# Patient Record
Sex: Male | Born: 1955 | Race: White | Hispanic: No | Marital: Married | Smoking: Never smoker
Health system: Southern US, Community
[De-identification: ages and names within clinical notes are randomized; demographics above are authoritative.]

---

## 2001-04-05 ENCOUNTER — Ambulatory Visit (HOSPITAL_COMMUNITY): Admission: RE | Admit: 2001-04-05 | Discharge: 2001-04-05 | Payer: Self-pay | Admitting: *Deleted

## 2001-04-05 ENCOUNTER — Encounter: Payer: Self-pay | Admitting: *Deleted

## 2002-05-10 ENCOUNTER — Ambulatory Visit (HOSPITAL_COMMUNITY): Admission: RE | Admit: 2002-05-10 | Discharge: 2002-05-10 | Payer: Self-pay | Admitting: Gastroenterology

## 2002-05-10 ENCOUNTER — Encounter (INDEPENDENT_AMBULATORY_CARE_PROVIDER_SITE_OTHER): Payer: Self-pay | Admitting: Specialist

## 2004-10-08 ENCOUNTER — Ambulatory Visit: Payer: Self-pay | Admitting: Psychology

## 2004-10-14 ENCOUNTER — Ambulatory Visit: Payer: Self-pay | Admitting: Psychology

## 2004-10-25 ENCOUNTER — Ambulatory Visit: Payer: Self-pay | Admitting: Psychology

## 2004-11-17 ENCOUNTER — Ambulatory Visit: Payer: Self-pay | Admitting: Psychology

## 2004-11-26 ENCOUNTER — Ambulatory Visit: Payer: Self-pay | Admitting: Psychology

## 2004-12-02 ENCOUNTER — Ambulatory Visit: Payer: Self-pay | Admitting: Psychology

## 2004-12-24 ENCOUNTER — Ambulatory Visit: Payer: Self-pay | Admitting: Psychology

## 2004-12-31 ENCOUNTER — Ambulatory Visit: Payer: Self-pay | Admitting: Psychology

## 2005-01-26 ENCOUNTER — Ambulatory Visit: Payer: Self-pay | Admitting: Psychology

## 2005-02-10 ENCOUNTER — Ambulatory Visit: Payer: Self-pay | Admitting: Psychology

## 2005-02-16 ENCOUNTER — Ambulatory Visit: Payer: Self-pay | Admitting: Psychology

## 2005-02-24 ENCOUNTER — Ambulatory Visit: Payer: Self-pay | Admitting: Psychology

## 2005-03-04 ENCOUNTER — Ambulatory Visit: Payer: Self-pay | Admitting: Psychology

## 2005-03-11 ENCOUNTER — Ambulatory Visit: Payer: Self-pay | Admitting: Psychology

## 2005-03-25 ENCOUNTER — Ambulatory Visit: Payer: Self-pay | Admitting: Psychology

## 2005-04-01 ENCOUNTER — Ambulatory Visit: Payer: Self-pay | Admitting: Psychology

## 2005-04-22 ENCOUNTER — Ambulatory Visit: Payer: Self-pay | Admitting: Psychology

## 2005-05-13 ENCOUNTER — Ambulatory Visit: Payer: Self-pay | Admitting: Psychology

## 2005-05-20 ENCOUNTER — Ambulatory Visit: Payer: Self-pay | Admitting: Psychology

## 2005-05-27 ENCOUNTER — Ambulatory Visit: Payer: Self-pay | Admitting: Psychology

## 2005-06-24 ENCOUNTER — Ambulatory Visit: Payer: Self-pay | Admitting: Psychology

## 2005-07-25 ENCOUNTER — Ambulatory Visit: Payer: Self-pay | Admitting: Psychology

## 2005-08-05 ENCOUNTER — Ambulatory Visit: Payer: Self-pay | Admitting: Psychology

## 2005-08-12 ENCOUNTER — Ambulatory Visit: Payer: Self-pay | Admitting: Psychology

## 2005-08-26 ENCOUNTER — Ambulatory Visit: Payer: Self-pay | Admitting: Psychology

## 2005-09-02 ENCOUNTER — Ambulatory Visit: Payer: Self-pay | Admitting: Psychology

## 2005-10-04 ENCOUNTER — Ambulatory Visit: Payer: Self-pay | Admitting: Psychology

## 2005-10-18 ENCOUNTER — Ambulatory Visit: Payer: Self-pay | Admitting: Psychology

## 2005-10-25 ENCOUNTER — Ambulatory Visit: Payer: Self-pay | Admitting: Psychology

## 2005-11-09 ENCOUNTER — Ambulatory Visit: Payer: Self-pay | Admitting: Psychology

## 2005-11-30 ENCOUNTER — Ambulatory Visit: Payer: Self-pay | Admitting: Psychology

## 2005-12-26 ENCOUNTER — Ambulatory Visit: Payer: Self-pay | Admitting: Psychology

## 2006-01-06 ENCOUNTER — Ambulatory Visit: Payer: Self-pay | Admitting: Psychology

## 2006-02-03 ENCOUNTER — Ambulatory Visit: Payer: Self-pay | Admitting: Psychology

## 2006-02-24 ENCOUNTER — Ambulatory Visit: Payer: Self-pay | Admitting: Psychology

## 2006-03-09 ENCOUNTER — Ambulatory Visit: Payer: Self-pay | Admitting: Psychology

## 2006-04-20 ENCOUNTER — Ambulatory Visit: Payer: Self-pay | Admitting: Psychology

## 2006-05-02 ENCOUNTER — Ambulatory Visit: Payer: Self-pay | Admitting: Psychology

## 2006-05-26 ENCOUNTER — Ambulatory Visit: Payer: Self-pay | Admitting: Psychology

## 2006-06-09 ENCOUNTER — Ambulatory Visit: Payer: Self-pay | Admitting: Psychology

## 2006-06-23 ENCOUNTER — Ambulatory Visit: Payer: Self-pay | Admitting: Psychology

## 2006-07-06 ENCOUNTER — Ambulatory Visit: Payer: Self-pay | Admitting: Psychology

## 2006-07-21 ENCOUNTER — Ambulatory Visit: Payer: Self-pay | Admitting: Psychology

## 2006-08-11 ENCOUNTER — Ambulatory Visit: Payer: Self-pay | Admitting: Psychology

## 2006-08-18 ENCOUNTER — Ambulatory Visit: Payer: Self-pay | Admitting: Psychology

## 2006-08-25 ENCOUNTER — Ambulatory Visit: Payer: Self-pay | Admitting: Psychology

## 2006-09-13 ENCOUNTER — Ambulatory Visit: Payer: Self-pay | Admitting: Psychology

## 2006-10-18 ENCOUNTER — Ambulatory Visit: Payer: Self-pay | Admitting: Psychology

## 2006-11-17 ENCOUNTER — Ambulatory Visit: Payer: Self-pay | Admitting: Psychology

## 2006-12-01 ENCOUNTER — Ambulatory Visit: Payer: Self-pay | Admitting: Psychology

## 2006-12-22 ENCOUNTER — Ambulatory Visit: Payer: Self-pay | Admitting: Psychology

## 2007-01-26 ENCOUNTER — Ambulatory Visit: Payer: Self-pay | Admitting: Psychology

## 2007-03-20 ENCOUNTER — Ambulatory Visit: Payer: Self-pay | Admitting: Psychology

## 2007-04-13 ENCOUNTER — Ambulatory Visit: Payer: Self-pay | Admitting: Psychology

## 2007-04-26 ENCOUNTER — Ambulatory Visit: Payer: Self-pay | Admitting: Psychology

## 2007-05-08 ENCOUNTER — Ambulatory Visit: Payer: Self-pay | Admitting: Psychology

## 2007-05-17 ENCOUNTER — Ambulatory Visit: Payer: Self-pay | Admitting: Psychology

## 2007-05-29 ENCOUNTER — Ambulatory Visit: Payer: Self-pay | Admitting: Psychology

## 2007-06-11 ENCOUNTER — Ambulatory Visit: Payer: Self-pay | Admitting: Psychology

## 2007-07-04 ENCOUNTER — Ambulatory Visit: Payer: Self-pay | Admitting: Psychology

## 2007-07-24 ENCOUNTER — Ambulatory Visit: Payer: Self-pay | Admitting: Psychology

## 2007-08-07 ENCOUNTER — Ambulatory Visit: Payer: Self-pay | Admitting: Psychology

## 2007-08-17 ENCOUNTER — Ambulatory Visit: Payer: Self-pay | Admitting: Psychology

## 2007-10-22 ENCOUNTER — Ambulatory Visit: Payer: Self-pay | Admitting: Psychology

## 2008-04-05 ENCOUNTER — Encounter
Admission: RE | Admit: 2008-04-05 | Discharge: 2008-04-05 | Payer: Self-pay | Admitting: Physical Medicine and Rehabilitation

## 2009-01-20 ENCOUNTER — Encounter: Admission: RE | Admit: 2009-01-20 | Discharge: 2009-01-20 | Payer: Self-pay | Admitting: Orthopedic Surgery

## 2010-08-13 NOTE — Op Note (Signed)
NAMEDEVIN, Cole Jensen                         ACCOUNT NO.:  [id number]   MEDICAL RECORD NO.:  [id number]                   PATIENT TYPE:  AMB   LOCATION:  ENDO                                 FACILITY:  [facility]   PHYSICIAN:  Petra Kuba, M.D.                 DATE OF BIRTH:  1955-08-17   DATE OF PROCEDURE:  05/10/2002  DATE OF DISCHARGE:                                 OPERATIVE REPORT   PROCEDURE:  Esophagogastroduodenoscopy with biopsy.   INDICATIONS FOR PROCEDURE:  Atypical reflux.   Consent was signed after risks, benefits, methods, and options.   MEDICINES USED:  Demerol 50, Versed 7.   DESCRIPTION OF PROCEDURE:  The video endoscope was inserted by direct  vision. The esophagus was normal. In the distal esophagus a small hiatal  hernia was seen. The scope passed into the stomach, advanced through a  normal antrum, normal pylorus into a normal duodenal bulb and around the C  loop to a normal second portion of the duodenum. The scope was then  withdrawn back to the bulb. A good look there ruled out ulcers in that  location. The stomach was evaluated on retroflexion. The scope was withdrawn  back to the stomach and retroflexed. High in the cardia, the hiatal hernia  was confirmed. The fundus, angularis, lesser and greater curve were normal  on retroflexed visualization. Straight visualization of the stomach did not  reveal any additional findings. He did have some difficulty holding air but  adequate visualization was obtained. The scope was then slowly withdrawn  back to 20 cm. No additional esophageal findings were seen except for the  small hiatal hernia. We went ahead and took a few distal esophageal biopsies  to rule out any microscopic changes. Air was suctioned, scope slowly  withdrawn. The patient tolerated the procedure well. There was on obvious or  immediate complications.   ENDOSCOPIC DIAGNOSIS:  1. Small hiatal hernia.  2. Otherwise normal EGD status post  distal esophageal biopsies.    PLAN:  Trial b.i.d. Aciphex, he seems to think that is helping him. Consider  manometry, 24 hour pH and then possibly even surgical options if the Aciphex  works. Will see back p.r.n. or in 6-8 weeks to recheck symptoms on the  Aciphex and possibly will need to add a motility medicine next like either  Reglan or Zelnorm.                                                Petra Kuba, M.D.    MEM/MEDQ  D:  05/10/2002  T:  05/10/2002  Job:  [id number]   cc:   Oley Balm. Georgina Pillion, M.D.  [address]  [place]  [place] [address]  Fax: [phone]   Shan Levans, M.D. [facility]  Jen Mow, M.D.

## 2013-01-28 ENCOUNTER — Telehealth: Payer: Self-pay | Admitting: *Deleted

## 2013-01-28 ENCOUNTER — Ambulatory Visit (INDEPENDENT_AMBULATORY_CARE_PROVIDER_SITE_OTHER): Payer: Managed Care, Other (non HMO) | Admitting: Podiatry

## 2013-01-28 ENCOUNTER — Encounter: Payer: Self-pay | Admitting: Podiatry

## 2013-01-28 VITALS — BP 140/86 | HR 86 | Resp 16

## 2013-01-28 DIAGNOSIS — M722 Plantar fascial fibromatosis: Secondary | ICD-10-CM

## 2013-01-28 NOTE — Progress Notes (Signed)
°  Subjective:    Patient ID: Cole Jensen, male    DOB: 1956-01-09, 57 y.o.   MRN: [id number] left  HPI left heel is hurting and I saw Dr Charlsie Merles at the gym this past weekend    Review of Systems  Constitutional: Negative.   HENT: Negative.   Eyes:       Dry eye  Respiratory: Negative.   Cardiovascular: Negative.   Gastrointestinal: Negative.   Endocrine: Negative.   Genitourinary: Negative.   Musculoskeletal: Negative.   Skin: Negative.   Allergic/Immunologic: Negative.   Neurological: Negative.   Hematological: Negative.   Psychiatric/Behavioral: Negative.        Objective:   Physical Exam        Assessment & Plan:

## 2013-01-28 NOTE — Telephone Encounter (Signed)
Pt states that he cannot wear the complicated boot we gave him today and will be by Monday to return it.  I called and encouraged pt wear the boot for positioning of the plantar fascia and that I would be happy to show him a easy to take it off and on.

## 2013-01-28 NOTE — Progress Notes (Signed)
Subjective:     Patient ID: Cole Jensen, male   DOB: [birth year], 57 y.o.   MRN: [id number]  HPI patient presents stating that his left heel is still hurting when he tries to exercise   Review of Systems  All other systems reviewed and are negative.       Objective:   Physical Exam  Nursing note and vitals reviewed. Constitutional: He is oriented to person, place, and time.  Cardiovascular: Intact distal pulses.   Musculoskeletal: Normal range of motion.  Neurological: He is oriented to person, place, and time.  Skin: Skin is warm.   pain to palpation of the left heel at its insertion to the medial tubercle     Assessment:     Plantar fasciitis left heel of long-term duration    Plan:     Recommended E. Pat treatment. This was performed and I was able to get him to level 4 2500 shocks and dispensed air fracture walker with instructions on usage. Reappoint one week

## 2013-01-29 NOTE — Telephone Encounter (Signed)
thanks

## 2013-01-31 ENCOUNTER — Telehealth: Payer: Self-pay | Admitting: *Deleted

## 2013-01-31 NOTE — Telephone Encounter (Signed)
Pt states has redness and lite blistery rash to heel.  Could this have been from the EPAT or the ice packs?  Return call - left message informed EPAT rarely but could cause rash, but if left ice pack directly on the skin it could also.  Instructed pt to apply antibiotic ointment and to place towel between skin and ice pack, use no longer then 10 - 15 mins at a time.

## 2013-02-04 ENCOUNTER — Encounter: Payer: Self-pay | Admitting: Podiatry

## 2013-02-04 ENCOUNTER — Ambulatory Visit (INDEPENDENT_AMBULATORY_CARE_PROVIDER_SITE_OTHER): Payer: Managed Care, Other (non HMO) | Admitting: Podiatry

## 2013-02-04 VITALS — BP 138/79 | HR 75 | Resp 12

## 2013-02-04 DIAGNOSIS — M722 Plantar fascial fibromatosis: Secondary | ICD-10-CM

## 2013-02-05 NOTE — Progress Notes (Signed)
Subjective:     Patient ID: Cole Jensen, male   DOB: 1955-07-04, 57 y.o.   MRN: [id number]  HPI heel is feeling better at this time   Review of Systems     Objective:   Physical Exam Diminished discomfort to pressure left plantar heel    Assessment:     Plantar fasciitis improving E. Pat    Plan:     2500 shocks 4.0 on energy level

## 2013-02-11 ENCOUNTER — Ambulatory Visit: Payer: Managed Care, Other (non HMO)

## 2013-02-11 ENCOUNTER — Ambulatory Visit (INDEPENDENT_AMBULATORY_CARE_PROVIDER_SITE_OTHER): Payer: Managed Care, Other (non HMO) | Admitting: Podiatry

## 2013-02-11 VITALS — BP 128/78 | HR 67 | Resp 16 | Ht 68.0 in | Wt 140.0 lb

## 2013-02-11 DIAGNOSIS — M722 Plantar fascial fibromatosis: Secondary | ICD-10-CM

## 2013-02-12 NOTE — Progress Notes (Signed)
Subjective:     Patient ID: Cole Jensen, male   DOB: January 27, 1956, 57 y.o.   MRN: [id number]  HPI patient states still improving but present   Review of Systems     Objective:   Physical Exam Continued discomfort plantar fascia heel    Assessment:     E. Pat administered and I was able to get a 4.6 on energy 2500 shocks    Plan:     Reappoint in 4 weeks and continue immobilization

## 2013-03-11 ENCOUNTER — Ambulatory Visit (INDEPENDENT_AMBULATORY_CARE_PROVIDER_SITE_OTHER): Payer: Managed Care, Other (non HMO) | Admitting: Podiatry

## 2013-03-11 ENCOUNTER — Encounter: Payer: Self-pay | Admitting: Podiatry

## 2013-03-11 VITALS — BP 121/82 | HR 82 | Resp 12

## 2013-03-11 DIAGNOSIS — M722 Plantar fascial fibromatosis: Secondary | ICD-10-CM

## 2013-03-11 NOTE — Progress Notes (Signed)
Subjective:     Patient ID: Cole Jensen, male   DOB: 1956-02-23, 57 y.o.   MRN: [id number]  HPI heel is improving  Review of Systems     Objective:   Physical Exam Pain to palpation heel diminishing    Assessment:     Improving plantar fasciitis    Plan:     EDennie Bible it ministered 4.0 2500 shocks

## 2015-11-04 ENCOUNTER — Ambulatory Visit (INDEPENDENT_AMBULATORY_CARE_PROVIDER_SITE_OTHER): Payer: Managed Care, Other (non HMO) | Admitting: Podiatry

## 2015-11-04 ENCOUNTER — Ambulatory Visit (INDEPENDENT_AMBULATORY_CARE_PROVIDER_SITE_OTHER): Payer: Managed Care, Other (non HMO)

## 2015-11-04 ENCOUNTER — Encounter: Payer: Self-pay | Admitting: Podiatry

## 2015-11-04 VITALS — BP 128/78 | HR 69

## 2015-11-04 DIAGNOSIS — M79672 Pain in left foot: Secondary | ICD-10-CM | POA: Diagnosis not present

## 2015-11-04 DIAGNOSIS — M779 Enthesopathy, unspecified: Secondary | ICD-10-CM

## 2015-11-04 MED ORDER — DICLOFENAC SODIUM 75 MG PO TBEC: 75.0000 mg | DELAYED_RELEASE_TABLET | Freq: Two times a day (BID) | ORAL | 2 refills | Status: AC

## 2015-11-04 NOTE — Progress Notes (Signed)
   Subjective:    Patient ID: Cole RamusRobert Jensen, male    DOB: Dec 10, 1955, 60 y.o.   MRN: [id number]  HPI    Review of Systems  All other systems reviewed and are negative.      Objective:   Physical Exam        Assessment & Plan:

## 2015-11-05 NOTE — Progress Notes (Signed)
Subjective:     Patient ID: Cole Jensen, male   DOB: Mar 06, 1956, 60 y.o.   MRN: [id number]  HPI patient presents with pain in the big toe joint with history of injury left foot   Review of Systems     Objective:   Physical Exam Neurovascular status intact muscle strength adequate with inflammation around the first MPJ left but no restriction of motion or crepitus    Assessment:     Appears to be more inflammatory    Plan:     Cole FlesherWent ahead did a very careful injection around the first MPJ 3 mg dexamethasone Kenalog 5 mg Xylocaine and advised on rigid bottom shoes and boot that he has  X-rays negative for signs of spur calcification or reduction of joint space

## 2016-03-08 ENCOUNTER — Other Ambulatory Visit: Payer: Self-pay | Admitting: Internal Medicine

## 2016-03-08 ENCOUNTER — Ambulatory Visit
Admission: RE | Admit: 2016-03-08 | Discharge: 2016-03-08 | Disposition: A | Discharge status: Home / Self Care | Payer: 59 | Source: Ambulatory Visit | Attending: Internal Medicine | Admitting: Internal Medicine

## 2016-03-08 DIAGNOSIS — R059 Cough, unspecified: Secondary | ICD-10-CM

## 2016-03-08 DIAGNOSIS — R05 Cough: Secondary | ICD-10-CM

## 2016-11-16 ENCOUNTER — Encounter: Payer: Self-pay | Admitting: Internal Medicine

## 2016-11-16 ENCOUNTER — Ambulatory Visit (INDEPENDENT_AMBULATORY_CARE_PROVIDER_SITE_OTHER): Payer: Managed Care, Other (non HMO) | Admitting: Internal Medicine

## 2016-11-16 DIAGNOSIS — B182 Chronic viral hepatitis C: Secondary | ICD-10-CM | POA: Diagnosis not present

## 2016-11-16 NOTE — Patient Instructions (Signed)
Date 11/16/16  Dear Cole Jensen; As discussed in the ID Clinic, your hepatitis C therapy will include highly effective medication(s) for treatment and will vary based on the type of hepatitis C and insurance approval.  Potential medications include:          Harvoni (sofosbuvir 90mg /ledipasvir 400mg ) tablet oral daily          OR     Epclusa (sofosbuvir 400mg /velpatasvir 100mg ) tablet oral daily          OR      Mavyret (glecaprevir 100 mg/pibrentasvir 40 mg): Take 3 tablets oral daily          OR     Zepatier (elbasvir 50 mg/grazoprevir 100 mg) oral daily, +/- ribavirin              Medications are typically for 8 or 12 weeks total ---------------------------------------------------------------- Your HCV Treatment Start Date: You will be notified by our office once the medication is approved and where you can pick it up (or if mailed)   ---------------------------------------------------------------- YOUR PHARMACY CONTACT:   [facility] [address] [place], [place] [address] Phone: [phone] Hours: Monday to Friday 7:30 am to 6:00 pm   Please always contact your pharmacy at least 3-4 business days before you run out of medications to ensure your next month's medication is ready or 1 week prior to running out if you receive it by mail.  Remember, each prescription is for 28 days. ---------------------------------------------------------------- GENERAL NOTES REGARDING YOUR HEPATITIS C MEDICATION:  Some medications have the following interactions:  - Acid reducing agents such as H2 blockers (ie. Pepcid (famotidine), Zantac (ranitidine), Tagamet (cimetidine), Axid (nizatidine) and proton pump inhibitors (ie. Prilosec (omeprazole), Protonix (pantoprazole), Nexium (esomeprazole), or Aciphex (rabeprazole)). Do not take until you have discussed with a health care provider.    -Antacids that contain magnesium and/or aluminum hydroxide (ie. Milk of Magensia, Rolaids,  Gaviscon, Maalox, Mylanta, an dArthritis Pain Formula).  -Calcium carbonate (calcium supplements or antacids such as Tums, Caltrate, Os-Cal).  -St. John's wort or any products that contain St. John's wort like some herbal supplements  Please inform the office prior to starting any of these medications.  - The common side effects associated with Harvoni include:      1. Fatigue      2. Headache      3. Nausea      4. Diarrhea      5. Insomnia  Please note that this only lists the most common side effects and is NOT a comprehensive list of the potential side effects of these medications. For more information, please review the drug information sheets that come with your medication package from the pharmacy.  ---------------------------------------------------------------- GENERAL HELPFUL HINTS ON HCV THERAPY: 1. Stay well-hydrated. 2. Notify the ID Clinic of any changes in your other over-the-counter/herbal or prescription medications. 3. If you miss a dose of your medication, take the missed dose as soon as you remember. Return to your regular time/dose schedule the next day.  4.  Do not stop taking your medications without first talking with your healthcare provider. 5.  You may take Tylenol (acetaminophen), as long as the dose is less than 2000 mg (OR no more than 4 tablets of the Tylenol Extra Strengths 500mg  tablet) in 24 hours. 6.  You will see our pharmacist-specialist within the first 2 weeks of starting your medication to monitor for any possible side effects. 7.  You will have labs once during treatment, soon  after treatment completion and one final lab 6 months after treatment completion to verify the virus is out of your system.  Cole Jensen, [place] for [place], [place]  [id number] [phone]

## 2016-11-16 NOTE — Progress Notes (Signed)
[facility]   CC: consideration for treatment for chronic hepatitis C  HPI:  +Cole Jensen is a 61 y.o. male who presents for initial evaluation and management of chronic hepatitis C.  Patient tested positive earlier this year during routine screening. Hepatitis C-associated risk factors present are: none. Patient denies history of blood transfusion, intranasal drug use, IV drug abuse, renal dialysis, sexual contact with person with liver disease, tattoos. Patient has had other studies performed. Results: hepatitis C RNA by PCR, result: positive. Patient has not had prior treatment for Hepatitis C. Patient does not have a past history of liver disease. Patient does not have a family history of liver disease. Patient does not  have associated signs or symptoms related to liver disease.  Labs reviewed and confirm chronic hepatitis C with a positive viral load, though just 348 copies.  Was previously tested earlier in the year and was undetectable.     Patient does not have documented immunity to Hepatitis A. Patient does not have documented immunity to Hepatitis B.    Review of Systems:  Constitutional: negative for fatigue and malaise Gastrointestinal: negative for diarrhea Musculoskeletal: negative for myalgias and arthralgias All other systems reviewed and are negative       PMH: insomnia, ED  Prior to Admission medications   Medication Sig Start Date End Date Taking? Authorizing Provider  X-VIATE 40 % CREA  02/18/13  Yes [provider]  diclofenac (VOLTAREN) 75 MG EC tablet Take 1 tablet (75 mg total) by mouth 2 (two) times daily. Patient not taking: Reported on 11/16/2016 11/04/15   Lenn Sink, DPM    Allergies  Allergen Reactions  . Sulfa Antibiotics     Social History  Substance Use Topics  . Smoking status: Never Smoker  . Smokeless tobacco: Never Used  . Alcohol use 1.8 oz/week    3 Glasses of wine per week     Comment:  daily     FMH: negative for cirrhosis, liver cancer   Objective:  Constitutional: in no apparent distress and alert,  Vitals:   11/16/16 0954  BP: (!) 171/100  Pulse: 73  Temp: 98.3 F (36.8 C)   Eyes: anicteric Cardiovascular: Cor RRR Respiratory: CTA B; normal respiratory effort Gastrointestinal: Bowel sounds are normal, liver is not enlarged, spleen is not enlarged Musculoskeletal: no pedal edema noted Skin: negatives: no rash; no porphyria cutanea tarda Lymphatic: no cervical lymphadenopathy   Laboratory Genotype: No results found for: HCVGENOTYPE HCV viral load: No results found for: HCVQUANT No results found for: WBC, HGB, HCT, MCV, PLT No results found for: CREATININE, BUN, NA, K, CL, CO2 No results found for: ALT, AST, GGT, ALKPHOS   Labs and history reviewed and show CHILD-PUGH unknown  5-6 points: Child class A 7-9 points: Child class B 10-15 points: Child class C  No results found for: INR, BILITOT, ALBUMIN   Assessment: New Patient with Chronic Hepatitis C genotype unknown, untreated.  I discussed with the patient the lab findings that confirm chronic hepatitis C as well as the natural history and progression of disease including about 30% of people who develop cirrhosis of the liver if left untreated and once cirrhosis is established there is a 2-7% risk per year of liver cancer and liver failure.  I discussed the importance of treatment and benefits in reducing the risk, even if significant liver fibrosis exists.   Plan: 1) Patient counseled extensively on limiting acetaminophen to no more than 2  grams daily, avoidance of alcohol. 2) Transmission discussed with patient including sexual transmission, sharing razors and toothbrush.   3) Will need referral to gastroenterology if concern for cirrhosis 4) Will need referral for substance abuse counseling: No.; Further work up to include urine drug screen  No. 5) Will prescribe appropriate medication based on  genotype and coverage  6) Hepatitis A and B titers 7) Pneumovax vaccine if not previously given - likely done by PCP due to age 13) Further work up to include liver staging with elastography 10) will follow up after starting medication  Unusually low viral load, fluctuating from undetected to minimal detection.  The risk benefit of treatment still favors treatment with minimal side effects.  Certainly if there is any liver fibrosis, it is indicated.   I will have him go ahead and get the elastography but wait until her returns from [place] in October to get the labs and start the process for the medication with the lab results in October.

## 2016-11-22 ENCOUNTER — Ambulatory Visit (HOSPITAL_COMMUNITY)
Admission: RE | Admit: 2016-11-22 | Discharge: 2016-11-22 | Disposition: A | Discharge status: Home / Self Care | Payer: Managed Care, Other (non HMO) | Source: Ambulatory Visit | Attending: Internal Medicine | Admitting: Internal Medicine

## 2016-11-22 DIAGNOSIS — B182 Chronic viral hepatitis C: Secondary | ICD-10-CM | POA: Insufficient documentation

## 2016-11-30 ENCOUNTER — Other Ambulatory Visit: Payer: Managed Care, Other (non HMO)

## 2016-11-30 DIAGNOSIS — B182 Chronic viral hepatitis C: Secondary | ICD-10-CM

## 2016-12-03 LAB — CBC WITH DIFFERENTIAL/PLATELET
BASOS ABS: 28 {cells}/uL (ref 0–200)
Basophils Relative: 0.4 %
EOS PCT: 3.9 %
Eosinophils Absolute: 269 cells/uL (ref 15–500)
HEMATOCRIT: 42.5 % (ref 38.5–50.0)
Hemoglobin: 14.4 g/dL (ref 13.2–17.1)
Lymphs Abs: 1932 cells/uL (ref 850–3900)
MCH: 32.2 pg (ref 27.0–33.0)
MCHC: 33.9 g/dL (ref 32.0–36.0)
MCV: 95.1 fL (ref 80.0–100.0)
MPV: 9.8 fL (ref 7.5–12.5)
Monocytes Relative: 10.4 %
NEUTROS PCT: 57.3 %
Neutro Abs: 3954 cells/uL (ref 1500–7800)
PLATELETS: 331 10*3/uL (ref 140–400)
RBC: 4.47 10*6/uL (ref 4.20–5.80)
RDW: 12.7 % (ref 11.0–15.0)
TOTAL LYMPHOCYTE: 28 %
WBC mixed population: 718 cells/uL (ref 200–950)
WBC: 6.9 10*3/uL (ref 3.8–10.8)

## 2016-12-03 LAB — HIV ANTIBODY (ROUTINE TESTING W REFLEX): HIV 1&2 Ab, 4th Generation: NONREACTIVE

## 2016-12-03 LAB — COMPLETE METABOLIC PANEL WITH GFR
AG Ratio: 2 (calc) (ref 1.0–2.5)
ALBUMIN MSPROF: 4.5 g/dL (ref 3.6–5.1)
ALT: 14 U/L (ref 9–46)
AST: 17 U/L (ref 10–35)
Alkaline phosphatase (APISO): 55 U/L (ref 40–115)
BILIRUBIN TOTAL: 1 mg/dL (ref 0.2–1.2)
BUN: 16 mg/dL (ref 7–25)
CALCIUM: 9.5 mg/dL (ref 8.6–10.3)
CO2: 28 mmol/L (ref 20–32)
CREATININE: 0.76 mg/dL (ref 0.70–1.25)
Chloride: 103 mmol/L (ref 98–110)
GFR, EST AFRICAN AMERICAN: 114 mL/min/{1.73_m2} (ref >=60)
GFR, EST NON AFRICAN AMERICAN: 98 mL/min/{1.73_m2} (ref >=60)
Globulin: 2.2 g/dL (calc) (ref 1.9–3.7)
Glucose, Bld: 83 mg/dL (ref 65–99)
Potassium: 4.6 mmol/L (ref 3.5–5.3)
Sodium: 140 mmol/L (ref 135–146)
TOTAL PROTEIN: 6.7 g/dL (ref 6.1–8.1)

## 2016-12-03 LAB — HCV RNA,QN PCR RFLX GENO, LIPABAD
HCV RNA, PCR, QN (Log): <1.18 log IU/mL
HCV RNA, PCR, QN: <15 IU/mL

## 2016-12-03 LAB — LIVER FIBROSIS, FIBROTEST-ACTITEST
ALT: 15 U/L (ref 9–46)
Alpha-2-Macroglobulin: 166 mg/dL (ref 106–279)
Apolipoprotein A1: 234 mg/dL — ABNORMAL HIGH (ref 94–176)
Bilirubin: 0.6 mg/dL (ref 0.2–1.2)
Fibrosis Score: 0.09
GGT: 19 U/L (ref 3–70)
Haptoglobin: 106 mg/dL (ref 43–212)
NECROINFLAMMAT ACT SCORE: 0.04
REFERENCE ID: 2098310

## 2016-12-03 LAB — HEPATITIS A ANTIBODY, TOTAL: HEPATITIS A AB,TOTAL: NONREACTIVE

## 2016-12-03 LAB — HEPATITIS B SURFACE ANTIGEN: Hepatitis B Surface Ag: NONREACTIVE

## 2016-12-03 LAB — HEPATITIS B CORE ANTIBODY, TOTAL: HEP B C TOTAL AB: NONREACTIVE

## 2016-12-03 LAB — PROTIME-INR
INR: 1
PROTHROMBIN TIME: 10.5 s (ref 9.0–11.5)

## 2016-12-03 LAB — HEPATITIS B SURFACE ANTIBODY,QUALITATIVE: Hep B S Ab: BORDERLINE — AB

## 2016-12-15 ENCOUNTER — Encounter: Payer: Self-pay | Admitting: Internal Medicine

## 2016-12-15 ENCOUNTER — Ambulatory Visit (INDEPENDENT_AMBULATORY_CARE_PROVIDER_SITE_OTHER): Payer: Managed Care, Other (non HMO) | Admitting: Internal Medicine

## 2016-12-15 VITALS — BP 151/90 | HR 60 | Temp 98.4°F | Ht 68.0 in | Wt 142.0 lb

## 2016-12-15 DIAGNOSIS — Z23 Encounter for immunization: Secondary | ICD-10-CM | POA: Diagnosis not present

## 2016-12-15 DIAGNOSIS — B182 Chronic viral hepatitis C: Secondary | ICD-10-CM | POA: Diagnosis not present

## 2016-12-15 NOTE — Progress Notes (Signed)
   Subjective:    Patient ID: Cole Jensen, male    DOB: Mar 30, 1955, 61 y.o.   MRN: [id number]  HPI Here for follow up of HCV. I saw him as a new patient and underwent evaluation.  He initially had a negative viral load and repeated and was a low positive virus.  He was sent to me and I pursued a work up including labs and elastography but the viral load was again undetected.  His elastography is F2/3 but the Fibrosure is F0 and A0.     Review of Systems  Constitutional: Negative for fatigue.  Gastrointestinal: Negative for diarrhea.  Skin: Negative for rash.  Neurological: Negative for dizziness.       Objective:   Physical Exam  Constitutional: He appears well-developed and well-nourished. No distress.  Eyes: No scleral icterus.          Assessment & Plan:

## 2016-12-15 NOTE — Assessment & Plan Note (Signed)
I discussed with him all of the results and my interpretation of everything.  His viral load initially was negative and then minimally positive but on recheck with me, it is again negative.  I discussed with him that I have seen that in persons who have completed treatment with a minimal but detectable virus that later shows clearance and is consistent with a false positive.   In addition, his Fibrosure blood test has 0 inflammatory activity (A0) and no fibrosis.  His elastography is a bit contradictory with a score of F2/3 but I explained to him that it is not a definitive test and taking all together, I do not suspect he has any fibrosis.  I also don't think repeating it will offer any benefit as the results would not change management.   He is ok for sexually activity without risk to his wife.  His antibody will always remain positive. He can return PRN.  15 minutes spent including 15 minutes face to face counseling and going over all the results with explanation.

## 2018-05-18 ENCOUNTER — Other Ambulatory Visit: Payer: Self-pay | Admitting: Internal Medicine

## 2018-05-18 ENCOUNTER — Ambulatory Visit
Admission: RE | Admit: 2018-05-18 | Discharge: 2018-05-18 | Disposition: A | Discharge status: Home / Self Care | Payer: Managed Care, Other (non HMO) | Source: Ambulatory Visit | Attending: Internal Medicine | Admitting: Internal Medicine

## 2018-05-18 DIAGNOSIS — M1990 Unspecified osteoarthritis, unspecified site: Secondary | ICD-10-CM

## 2019-09-24 ENCOUNTER — Other Ambulatory Visit: Payer: Self-pay | Admitting: Family Medicine

## 2019-09-24 ENCOUNTER — Ambulatory Visit
Admission: RE | Admit: 2019-09-24 | Discharge: 2019-09-24 | Disposition: A | Discharge status: Home / Self Care | Payer: 59 | Source: Ambulatory Visit | Attending: Family Medicine | Admitting: Family Medicine

## 2019-09-24 DIAGNOSIS — M79642 Pain in left hand: Secondary | ICD-10-CM

## 2021-03-23 ENCOUNTER — Other Ambulatory Visit: Payer: Self-pay | Admitting: Sports Medicine

## 2021-03-23 ENCOUNTER — Other Ambulatory Visit: Payer: Self-pay

## 2021-03-23 ENCOUNTER — Ambulatory Visit
Admission: RE | Admit: 2021-03-23 | Discharge: 2021-03-23 | Disposition: A | Discharge status: Home / Self Care | Payer: 59 | Source: Ambulatory Visit | Attending: Sports Medicine | Admitting: Sports Medicine

## 2021-03-23 DIAGNOSIS — M25551 Pain in right hip: Secondary | ICD-10-CM

## 2022-09-07 ENCOUNTER — Ambulatory Visit: Payer: Medicare Other | Admitting: Behavioral Health

## 2022-09-07 DIAGNOSIS — F4323 Adjustment disorder with mixed anxiety and depressed mood: Secondary | ICD-10-CM

## 2022-09-07 NOTE — Progress Notes (Signed)
[facility] Counselor Initial Adult Exam  Name: Rami Chapp Date: 09/07/2022 MRN: [id number] DOB: 08-11-1955 PCP: Nila Nephew, MD  Time spent: 60 min In person @ [facility]  Guardian/Payee:  Medicare Part A & B    Paperwork requested: No   Reason for Visit /Presenting Problem: Pt exp'g death anxiety, fears of future caregiving for his Wife, & loneliness  Mental Status Exam: Appearance:   Casual     Behavior:  Appropriate and Sharing  Motor:  Normal  Speech/Language:   Clear and Coherent and Normal Rate  Affect:  Appropriate  Mood:  normal  Thought process:  normal  Thought content:    WNL  Sensory/Perceptual disturbances:    WNL  Orientation:  oriented to person, place, and time/date  Attention:  Good  Concentration:  Good  Memory:  WNL  Fund of knowledge:   Good  Insight:    Good  Judgment:   Good  Impulse Control:  Good    Risk Assessment: Danger to Self:  No Self-injurious Behavior: No Danger to Others: No Duty to Warn:no Physical Aggression / Violence:No  Access to Firearms a concern: No  Gang Involvement:No  Patient / guardian was educated about steps to take if suicide or homicide risk level increases between visits: yes; appropriate to ICD process While future psychiatric events cannot be accurately predicted, the patient does not currently require acute inpatient psychiatric care and does not currently meet [facility] involuntary commitment criteria.  Substance Abuse History: Current substance abuse: Yes   Pt self-identifies the need to curb his habit of drinking wine every night to numb his concerns for his marital relationship, death anxiety, feeling lonely, & his desire to avoid future caregiving for his Wife Terri.  Past Psychiatric History:   No previous psychological problems have been observed Outpatient Providers:Dr. Concha Se, MD History of Psych Hospitalization: No  Psychological Testing:  NA    Abuse History:   Victim of: No.,  NA    Report needed: No. Victim of Neglect:No. Perpetrator of  NA   Witness / Exposure to Domestic Violence: No   Protective Services Involvement: No  Witness to [employer] Violence:  No   Family History: No family history on file.  Living situation: the patient lives with their spouse  Sexual Orientation: Straight  Relationship Status: married 40 yrs this Anniversary & known since he was Sr. In [employer] Name of spouse / other:Terri If a parent, number of children / ages:32yo Dtr Hale Bogus who lives in [place] & stayed after her Graduation from [employer] in [place]  Support Systems: friends Dtr Unsure the role Wife plays in this relationship. 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 Service: No   Educational History: Education: college graduate  Religion/Sprituality/World View: Unk  Any cultural differences that may affect / interfere with treatment:  None noted today  Recreation/Hobbies: music; Pt plays guitar, tennis, & likes to stay busy tutoring students  Stressors: Loss of his expectations for him & Wife to cont to travel & her health posing many issues for them now   Marital or family conflict    Strengths: Supportive Relationships, Family, Friends, Hopefulness, and Self Advocate  Barriers:  None noted today   Legal History: Pending legal issue / charges: The patient has no significant history of legal issues.  History of legal issue / charges:  NA  Medical History/Surgical History: reviewed No past medical history on file.  No past surgical history on file.  Medications: Current Outpatient Medications  Medication Sig Dispense Refill   diclofenac (VOLTAREN) 75 MG EC tablet Take 1 tablet (75 mg total) by mouth 2 (two) times daily. (Patient not  taking: Reported on 11/16/2016) 50 tablet 2   X-VIATE 40 % CREA      No current facility-administered medications for this visit.    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 He will keep a Notebook to record his thoughts, feelings, & challenges btwn our visits. Pt goals include:   -Death anxiety - explore the source of this  -Fear of the Caregiving role  -Explore fears of the future w/his marriage & his Wife's health issues  -Explore loneliness   Target Date: 10/10/2022 Progress: 2 Frequency: Once every 3-4 wks Modality: [name], LMFT

## 2022-09-07 NOTE — Progress Notes (Signed)
                Seaton Hofmann L Shivaay Stormont, LMFT 

## 2022-09-27 ENCOUNTER — Ambulatory Visit: Payer: Medicare Other | Admitting: Behavioral Health

## 2022-10-19 ENCOUNTER — Ambulatory Visit (INDEPENDENT_AMBULATORY_CARE_PROVIDER_SITE_OTHER): Payer: Medicare Other | Admitting: Behavioral Health

## 2022-10-19 DIAGNOSIS — F4323 Adjustment disorder with mixed anxiety and depressed mood: Secondary | ICD-10-CM | POA: Diagnosis not present

## 2022-10-19 NOTE — Progress Notes (Signed)
                Victoria L Winstead, LMFT 

## 2022-10-19 NOTE — Progress Notes (Addendum)
[facility] Counselor/Therapist Progress Note  Patient ID: Cole Jensen, MRN: [id number],    Date: 10/19/2022  Time Spent: 55 min In Person @ [facility] - [facility] Office  Time In: 9:00am Time Out: 9:55am  Treatment Type: Individual Therapy  Reported Symptoms: Some reduction in anx/dep & marital stress as Pt is thinking through the therapeutic process & recording his thoughts in a Notebook  Mental Status Exam: Appearance:  Casual and Neat     Behavior: Appropriate and Sharing  Motor: Normal  Speech/Language:  Clear and Coherent and Normal Rate  Affect: Appropriate  Mood: anxious  Thought process: normal  Thought content:   WNL  Sensory/Perceptual disturbances:   WNL  Orientation: oriented to person, place, time/date, and situation  Attention: Good  Concentration: Good  Memory: WNL  Fund of knowledge:  Good  Insight:   Good  Judgment:  Good  Impulse Control: Good   Risk Assessment: Danger to Self:  No Self-injurious Behavior: No Danger to Others: No Duty to Warn:no Physical Aggression / Violence:No  Access to Firearms a concern: No  Gang Involvement:No   Subjective: Pt is upbeat today & sharing his insights from first session. He & Wife [name] have travelled to see her Mother in [place] & he agreed to go this last time before they work out something so she can travel solo. Pt liked the idea of doing nothing & what that might mean for himself.   Cpl has a pursuer/distancer cycle in which Wife will somatasize her aches & pains, Pt will meet this as he is able & then distance from her when it gets too heavy. When Wife realizes he is distancing, she pursues him w/affection.   Discussed Wife joining Therapy & Pt is hesitant about this until he feels the safety is present on her part to enable this to happen.  Pt shared his emotional range & how he is highly in touch w/his feelings. "Others bond to me when I share this". Pt sts in the Cpl's best moments they can achieve this.    Interventions: Insight-Oriented and Family Systems  Diagnosis:Adjustment reaction with anxiety and depression  Plan: Ames is focused on his relationship today & concerned for Wife's health. Encouraged Pt to defer from tendenccy to medicalize Wife & instead try seeing her through another lens, possibly illness narratives & Family Systems. Osamu will explore this feature of human interaction & bring his 'Foundational Aspects of his Marriage' into next session for [place] to explore.   Target Date: 11/25/2022  Progress: 6  Frequency: Once every 3-4 wks as Pt schedule allows w/his travel  Modality: Rudean Hitt  We will explore illness narratives & the book by Valeda Malm, ' Rewriting Stories' for Cpl work.   Deneise Lever, LMFT

## 2022-11-17 ENCOUNTER — Ambulatory Visit (INDEPENDENT_AMBULATORY_CARE_PROVIDER_SITE_OTHER): Payer: Medicare Other | Admitting: Behavioral Health

## 2022-11-17 DIAGNOSIS — F4323 Adjustment disorder with mixed anxiety and depressed mood: Secondary | ICD-10-CM | POA: Diagnosis not present

## 2022-11-17 NOTE — Progress Notes (Signed)
                Victoria L Winstead, LMFT 

## 2022-11-21 ENCOUNTER — Other Ambulatory Visit: Payer: Self-pay | Admitting: Oncology

## 2022-11-21 DIAGNOSIS — Z006 Encounter for examination for normal comparison and control in clinical research program: Secondary | ICD-10-CM

## 2022-11-24 NOTE — Progress Notes (Signed)
[facility] Counselor/Therapist Progress Note  Patient ID: Cole Jensen, MRN: [id number],    Date: 11/24/2022  Time Spent: 55 min In Person @ [facility] - HPC Office Time In: 9:00am Time Out: 9:55am   Treatment Type: Individual Therapy  Reported Symptoms: Dec in Sx of anx/dep & stress as Pt has exp'd break throughs in his understanding of his life, his marriage, & his future  Mental Status Exam: Appearance:  Casual and Neat     Behavior: Appropriate, Sharing, and Motivated  Motor: Normal  Speech/Language:  Clear and Coherent  Affect: Appropriate  Mood: normal  Thought process: normal  Thought content:   WNL  Sensory/Perceptual disturbances:   WNL  Orientation: oriented to person, place, time/date, and situation  Attention: Good  Concentration: Good  Memory: WNL  Fund of knowledge:  Good  Insight:   Good to excellent skills  Judgment:  Good  Impulse Control: Good   Risk Assessment: Danger to Self:  No Self-injurious Behavior: No Danger to Others: No Duty to Warn:no Physical Aggression / Violence:No  Access to Firearms a concern: No  Gang Involvement:No   Subjective: Since our last visit, Pt has travelled extensively in the [place]. This has prospered him. He has attended [employer] (wonderful & creative exp in [place]), travelled w/his Wife Terri to [place], travelled for business to [place]; also [place]. [employer] shared w/Clinician today!  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 This has promoted a freedom of spirit for him. He sts, "I have broken the vicious cycle w/Terri, she has been more affectionate!"  Pt notes he is living life to the fullest to quell his anxiety & fear of death. He expects death, but does not accept it.   Interventions: Humanistic/Existential, Grief Therapy, and Family Systems  Diagnosis:Adjustment reaction with anxiety and depression  Plan: Pt would like to discuss his Dtr Sharman Crate in our next  session.  Target Date: 12/26/2022  Progress: 7  Frequency: Once every 2-3 wks  Modality: [name], LMFT

## 2022-12-29 ENCOUNTER — Ambulatory Visit: Payer: Medicare Other | Admitting: Behavioral Health

## 2022-12-29 DIAGNOSIS — F4323 Adjustment disorder with mixed anxiety and depressed mood: Secondary | ICD-10-CM | POA: Diagnosis not present

## 2022-12-29 NOTE — Progress Notes (Signed)
                Cole Jensen L Farryn Linares, LMFT 

## 2022-12-29 NOTE — Progress Notes (Signed)
[facility] Counselor/Therapist Progress Note  Patient ID: Cole Jensen, MRN: [id number],    Date: 12/29/2022  Time Spent: 55 min In Person @ [facility] - HPC Office  Time In: 9:00am Time Out: 9:55am  Treatment Type: Individual Therapy  Reported Symptoms: Inc in anx/dep & stress due to re-entry into work life after 2 wk vacation in [place]. Pt is concerned for the health of his marital relationship.   Mental Status Exam: Appearance:  Casual and Neat     Behavior: Appropriate, Sharing, & Motivated  Motor: Normal  Speech/Language:  Clear and Coherent and Normal Rate  Affect: Appropriate  Mood: anxious  Thought process: normal  Thought content:   WNL  Sensory/Perceptual disturbances:   WNL  Orientation: oriented to person, place, time/date, and situation  Attention: Good  Concentration: Good  Memory: WNL  Fund of knowledge:  Good  Insight:   Good  Judgment:  Good  Impulse Control: Good   Risk Assessment: Danger to Self:  No Self-injurious Behavior: No Danger to Others: No Duty to Warn:no Physical Aggression / Violence:No  Access to Firearms a concern: No  Gang Involvement:No   Subjective: Pt describes a great vacation to [place] this past month in Sept for his 58th Anniv. He & Wife Cole Jensen travelled the countryside & she was plagued w/many issues due to her challenges w/germs. The trip brought out a lot of issues that occur @ home, so Pt moved into the vacation w/an attitude of, "Accept & Accommodate". He realizes he has been judgmental about his Wife's mental health & this may not be helpful for him or for her.  Discussed the merits of Cpl Th @ the present time. Pt will discern whether he will invite her into the next session of psychotherapy. He will invite according to the suggestions made today if indicated.    Interventions: Insight-Oriented and Family Systems  Diagnosis:Adjustment reaction with anxiety and depression  Plan: Cole Jensen enjoyed the beautiful  countryside of [place], [place] for 2 wks near the end of Sept. He realizes all the issues in his marriage exist @ home & in other locations-they do not subside or disappear. He feels resentment that his Wife is having issues & he "never signed up for this when we first married". He came to the conclusion that his relationship is, "the one big thing" in his life he needs to care for more than ever. He will consider inviting his Wife into our next session by telling his Wife Clinician thinks it will be valuable to hear her voice.  Target Date: 01/25/2023  Progress: 6  Frequency: Once monthly per Pt request  Modality: [name], LMFT

## 2023-01-26 ENCOUNTER — Ambulatory Visit: Payer: Medicare Other | Admitting: Behavioral Health

## 2023-01-26 DIAGNOSIS — F4323 Adjustment disorder with mixed anxiety and depressed mood: Secondary | ICD-10-CM

## 2023-01-26 NOTE — Progress Notes (Unsigned)
                Anastasya Jewell L Farryn Linares, LMFT 

## 2023-01-27 NOTE — Progress Notes (Unsigned)
[facility] Counselor/Therapist Progress Note  Patient ID: Cole Jensen, MRN: [id number],    Date: 01/27/2023  Time Spent: 55 min In Person @ [facility] - [facility] Office  Time In: 9:00 M Time Out: 9:55am  Treatment Type: Individual Therapy  Reported Symptoms: Elevated anx/dep & stress due to recent health status changes reported by a childhood friend that has impacted Pt highly.  Mental Status Exam: Appearance:  Casual and Neat     Behavior: Appropriate and Sharing  Motor: Normal  Speech/Language:  Clear and Coherent  Affect: Appropriate  Mood: normal  Thought process: normal  Thought content:   WNL  Sensory/Perceptual disturbances:   WNL  Orientation: oriented to person, place, time/date, and situation  Attention: Good  Concentration: Good  Memory: WNL  Fund of knowledge:  Good  Insight:   Good  Judgment:  Good  Impulse Control: Good   Risk Assessment: Danger to Self:  No Self-injurious Behavior: No Danger to Others: No Duty to Warn:no Physical Aggression / Violence:No  Access to Firearms a concern: No  Gang Involvement:No   Subjective: ***   Interventions: Insight-Oriented and Family Systems  Diagnosis:Adjustment reaction with anxiety and depression  Plan: ***  Deneise Lever, LMFT

## 2023-02-21 ENCOUNTER — Ambulatory Visit (INDEPENDENT_AMBULATORY_CARE_PROVIDER_SITE_OTHER): Payer: Medicare Other | Admitting: Behavioral Health

## 2023-02-21 DIAGNOSIS — F4323 Adjustment disorder with mixed anxiety and depressed mood: Secondary | ICD-10-CM

## 2023-02-21 NOTE — Progress Notes (Signed)
[facility] Counselor/Therapist Progress Note  Patient ID: Cole Jensen, MRN: [id number],    Date: 02/21/2023  Time Spent: 55 min In Person @[facility]  - HPC Office Time In: 11:00am Time Out: 11:55am   Treatment Type: Individual Therapy  Reported Symptoms: Elevated stress due to RTW & home & Pt desire to maintain his progress since trip to [place] for one week has lifted his spirits & been cause for time alone w/his thoughts to grow & change & progress.   Mental Status Exam: Appearance:  Casual and Neat     Behavior: Appropriate and Sharing  Motor: Normal  Speech/Language:  Clear and Coherent and Normal Rate  Affect: Appropriate  Mood: normal  Thought process: normal  Thought content:   WNL  Sensory/Perceptual disturbances:   WNL  Orientation: oriented to person, place, time/date, and situation  Attention: Good  Concentration: Good  Memory: WNL  Fund of knowledge:  Good  Insight:   Good  Judgment:  Good  Impulse Control: Good   Risk Assessment: Danger to Self:  No Self-injurious Behavior: No Danger to Others: No Duty to Warn:no Physical Aggression / Violence:No  Access to Firearms a concern: No  Gang Involvement:No   Subjective: Pt had a wonderful trip to [place] where he explored [place] for a week. He enjoyed touring & going to [employer] where he saw works of the [employer]. The aesthetics of the City & the historical significance of the location brought him joy & awe. He met w/his Best Friend's 2 children who promoted further joy & self-understanding. Pt is learning many things about himself that are significant & he values his journey; he does not need to go 'one down' any longer. He can live the truth. His competency is not in question. He is smart enough & good enough.    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   Pt also responded in a discerning way to whether he & Wife are growing away from ea other. He is still in acceptance/accommodation mode w/his Wife Terri & understands her existence better now that he knows himself more. They often get in the Pursuer/Distancer dance & he has learned to invite his Wife back into their circle more-she accepts this invitation more readily now.   Interventions: Humanistic/Existential and Insight-Oriented  Diagnosis:Adjustment reaction with anxiety and depression  Plan: Karlis is writing & learning & growing in his journey to self. He has spent time alone in the past month on trips that have served him well. He cont's to journal his thoughts, experiences, & worries in his Notebook. We will ck on the maintenance of his progress @ our next session.  Target Date: 03/27/2023  Progress: 7  Frequency: Once every 2-3 wks  Modality: [name], LMFT

## 2023-02-21 NOTE — Progress Notes (Signed)
                Anastasya Jewell L Farryn Linares, LMFT 

## 2023-02-22 ENCOUNTER — Ambulatory Visit: Payer: Medicare Other | Admitting: Behavioral Health

## 2023-02-28 ENCOUNTER — Ambulatory Visit: Payer: Medicare Other | Admitting: Behavioral Health

## 2023-03-15 ENCOUNTER — Ambulatory Visit: Payer: Medicare Other | Admitting: Behavioral Health

## 2023-03-15 DIAGNOSIS — F4323 Adjustment disorder with mixed anxiety and depressed mood: Secondary | ICD-10-CM | POA: Diagnosis not present

## 2023-03-15 NOTE — Progress Notes (Signed)
                Anastasya Jewell L Farryn Linares, LMFT 

## 2023-04-12 ENCOUNTER — Ambulatory Visit (INDEPENDENT_AMBULATORY_CARE_PROVIDER_SITE_OTHER): Payer: Medicare Other | Admitting: Behavioral Health

## 2023-04-12 DIAGNOSIS — R4589 Other symptoms and signs involving emotional state: Secondary | ICD-10-CM

## 2023-04-12 DIAGNOSIS — F4323 Adjustment disorder with mixed anxiety and depressed mood: Secondary | ICD-10-CM | POA: Diagnosis not present

## 2023-04-13 NOTE — Progress Notes (Signed)
[facility] Counselor/Therapist Progress Note  Patient ID: Cole Jensen, MRN: [id number],    Date: 03/15/2023  Time Spent: 55 min In Person @ [facility] - HPC Office Time In: 11:00am Time Out: 11:55am  Treatment Type: Individual Therapy  Reported Symptoms: Reduction in anx/dep & stress  Mental Status Exam: Appearance:  Casual and Neat     Behavior: Appropriate and Sharing  Motor: Normal  Speech/Language:  Clear and Coherent and Normal Rate  Affect: Appropriate  Mood: normal  Thought process: normal  Thought content:   WNL  Sensory/Perceptual disturbances:   WNL  Orientation: oriented to person, place, time/date, and situation  Attention: Good  Concentration: Good  Memory: WNL  Fund of knowledge:  Good  Insight:   Good  Judgment:  Good  Impulse Control: Good   Risk Assessment: Danger to Self:  No Self-injurious Behavior: No Danger to Others: No Duty to Warn:no Physical Aggression / Violence:No  Access to Firearms a concern: No  Gang Involvement:No   Subjective: Pt exp'd a great trip to [place]. He felt freedom to do as he wished & the time to explore & enjoy the surroundings. Pt sts he & Wife [name] are good.  On a plane trip home from [place], Pt met a woman & they have subsequently met twice for coffee. Pt expresses how vulnerable he made himself, thinking he should be transparent about not having an Affair. Since that time, he has noticed his Wife more & sees he could never have an Oncologist w/in his marriage. It felt clique, but he addressed his concerns w/the woman & she reassured him an Affair was not going to happen. They are friends & "two ppl w/a conscience".  Wife did ask Pt if she could attend his open mic night @ the [employer]. This bothered him bc his musical performing is his personal space. He allowed she could attend, but she has issues w/masking & germs. He has decided they need to schedule an appt on the wknds for intimacy & this has been  initiated.   Pt claims fear of abandonment today as he has been observing himself closely.  His Mother died of cancer in the early 1970's when he was a Teen. This impacted him highly. He has several friends who all have sig health issues. This has caused his anxiety about death to increase.  Interventions: Insight-Oriented and Existential approach to life, death, & dying  Diagnosis:Adjustment reaction with anxiety and depression, Existential crisis  Plan: Cole Jensen has been exploring his own needs for his life during this time. His trip to [place] was a turning point. His good friend's Adult Children met him there & reported on their Dad's "toxic positivity" as a detriment to his health. This comment shook Cole. Jensen is finding his voice @ this time in his life & searching for more meaning & purpose. He has made himself vulnerable & acknowledged men do wander & wonder about other women, although he declined the desire for an Affair.   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 He gets great pleasure from this. He will find ways to appreciate his Wife Cole Jensen more & do addt'l things w/her when they create the time on wknds. Target Date: 04/12/2023  Progress: 7  Frequency: Once every 2-3 wks  Modality: [name], LMFT

## 2023-04-23 NOTE — Progress Notes (Signed)
[facility] Counselor/Therapist Progress Note  Patient ID: Cole Jensen, MRN: [id number],    Date: 04/12/2023  Time Spent: 55 min In Person @ [facility] - [facility] Office Time In: 11:00am Time Out: 11:55am  Treatment Type: Individual Therapy  Reported Symptoms: Elevated anx due to health status changes   Mental Status Exam: Appearance:  Casual and Neat     Behavior: Appropriate and Sharing  Motor: Normal  Speech/Language:  Clear and Coherent  Affect: Appropriate  Mood: normal  Thought process: normal  Thought content:   WNL  Sensory/Perceptual disturbances:   WNL  Orientation: oriented to person, place, time/date, and situation  Attention: Good  Concentration: Good  Memory: WNL  Fund of knowledge:  Good  Insight:   Good  Judgment:  Good  Impulse Control: Good   Risk Assessment: Danger to Self:  No Self-injurious Behavior: No Danger to Others: No Duty to Warn:no Physical Aggression / Violence:No  Access to Firearms a concern: No  Gang Involvement:No   Subjective: Pt is crossing new milestones & his recent visit w/Dr. Judithann Jensen @ [facility] made him aware of his age more acutely. Physician offered to Pt he may want a Statin on board for inc'd control of BPH. Pt has done his research on this & is not committed to use of a pill. He is committed to no medications & no surgery. His good friends report they have been taking statins for yrs. He was unaware. This visit to the PCP has re-awakened Pt's death narrative concerns for himself.   Pt is more aware of the health status changes for those close to him than ever before & he is also anxious about losing any of his support network. 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 He will cont to be proactive about his health & that of his Wife Cole Jensen. He will explore the opportunities to Volunteer @ Hospice & see what he needs to do for this to happen. This might expose him to the sacred moments around ppl who are dying.  Target Date: 05/13/2023  Progress: 7  Frequency: Once every 2-3 wks  Modality: Cole Fraise, LMFT

## 2023-04-23 NOTE — Progress Notes (Signed)
Deneise Lever, LMFT

## 2023-04-24 ENCOUNTER — Ambulatory Visit: Payer: Medicare Other | Admitting: Behavioral Health

## 2023-04-24 NOTE — Progress Notes (Unsigned)
Deneise Lever, LMFT

## 2023-05-18 ENCOUNTER — Ambulatory Visit: Payer: Medicare Other | Admitting: Behavioral Health

## 2023-06-13 ENCOUNTER — Ambulatory Visit: Payer: Medicare Other | Admitting: Behavioral Health

## 2023-06-13 DIAGNOSIS — F4323 Adjustment disorder with mixed anxiety and depressed mood: Secondary | ICD-10-CM

## 2023-06-13 DIAGNOSIS — R4589 Other symptoms and signs involving emotional state: Secondary | ICD-10-CM

## 2023-06-13 NOTE — Progress Notes (Signed)
   Deneise Lever, LMFT

## 2023-06-13 NOTE — Progress Notes (Addendum)
 [facility] Counselor/Therapist Progress Note  Patient ID: Cole Jensen, MRN: [id number],    Date: 06/13/2023  Time Spent: 50 min In Person visit @ [facility] - HPC Office Time In: 1:00pm Time Out:  1:50pm  Treatment Type: Individual Therapy  Reported Symptoms: Elevated concern/anxiety re: death & dying  Mental Status Exam: Appearance:  Casual and Neat     Behavior: Appropriate and Sharing  Motor: Normal  Speech/Language:  Clear and Coherent  Affect: Appropriate  Mood: normal  Thought process: normal  Thought content:   WNL  Sensory/Perceptual disturbances:   WNL  Orientation: oriented to person, place, time/date, and situation  Attention: Good  Concentration: Good  Memory: WNL  Fund of knowledge:  Good  Insight:   Good  Judgment:  Good  Impulse Control: Good   Risk Assessment: Danger to Self:  No Self-injurious Behavior: No Danger to Others: No Duty to Warn:no Physical Aggression / Violence:No  Access to Firearms a concern: No  Gang Involvement:No   Subjective: Pt is actively playing 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 This gives him pleasure. He expresses seeing his life through new lenses today. Asked Pt how he imagines himself to be @ 68yrs old. Pt wants to maintain efforts to help others; his work @ [employer] his need for this endeavor. He wants to [employer] as suggested to dec his fears of death. He wants to cont his exercise & learn how to play [employer]. He wants to enjoy his friends. He wants the best relationship w/his Wife Cole Jensen he can possibly possess. 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   Pt details how he feels death all around him & wants to actively do the Volunteer work @ Hospice to reduce his discomfort. He fears death bc he has never been around or worked w/anyone dying. Even his MIL, Cole Jensen who is 68yo is a lively person.  Interventions: Ego-Supportive and Insight-Oriented  Diagnosis:Adjustment  reaction with anxiety and depression  Anxiety about health  Plan: Cole Jensen will inc his efforts w/the Homeless @ [address] as this is a positive activity. 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 He has reached the pinnacle of his career & wants to see what comes next.  Target Date: 07/14/2023  Progress: 7  Frequency: Once every 2-3 wks  Modality: Kennis Richerd LITTIE Hollace, LMFT

## 2023-07-25 ENCOUNTER — Ambulatory Visit: Admitting: Behavioral Health

## 2023-08-10 ENCOUNTER — Ambulatory Visit: Admitting: Behavioral Health

## 2023-08-23 ENCOUNTER — Ambulatory Visit: Admitting: Behavioral Health

## 2023-09-20 ENCOUNTER — Ambulatory Visit (INDEPENDENT_AMBULATORY_CARE_PROVIDER_SITE_OTHER): Admitting: Behavioral Health

## 2023-09-20 DIAGNOSIS — R4589 Other symptoms and signs involving emotional state: Secondary | ICD-10-CM | POA: Diagnosis not present

## 2023-09-20 DIAGNOSIS — F4323 Adjustment disorder with mixed anxiety and depressed mood: Secondary | ICD-10-CM | POA: Diagnosis not present

## 2023-09-20 NOTE — Progress Notes (Signed)
   Deneise Lever, LMFT

## 2023-11-03 IMAGING — CR DG HIP (WITH OR WITHOUT PELVIS) 2-3V*R*
3 series · 3 of 3 positions shown · non-contrast
Comparison: None.

CLINICAL DATA: Posterior right buttock pain for 3 weeks. No known
injury.

EXAM:
DG HIP (WITH OR WITHOUT PELVIS) 2-3V RIGHT

[w pelvis upright]
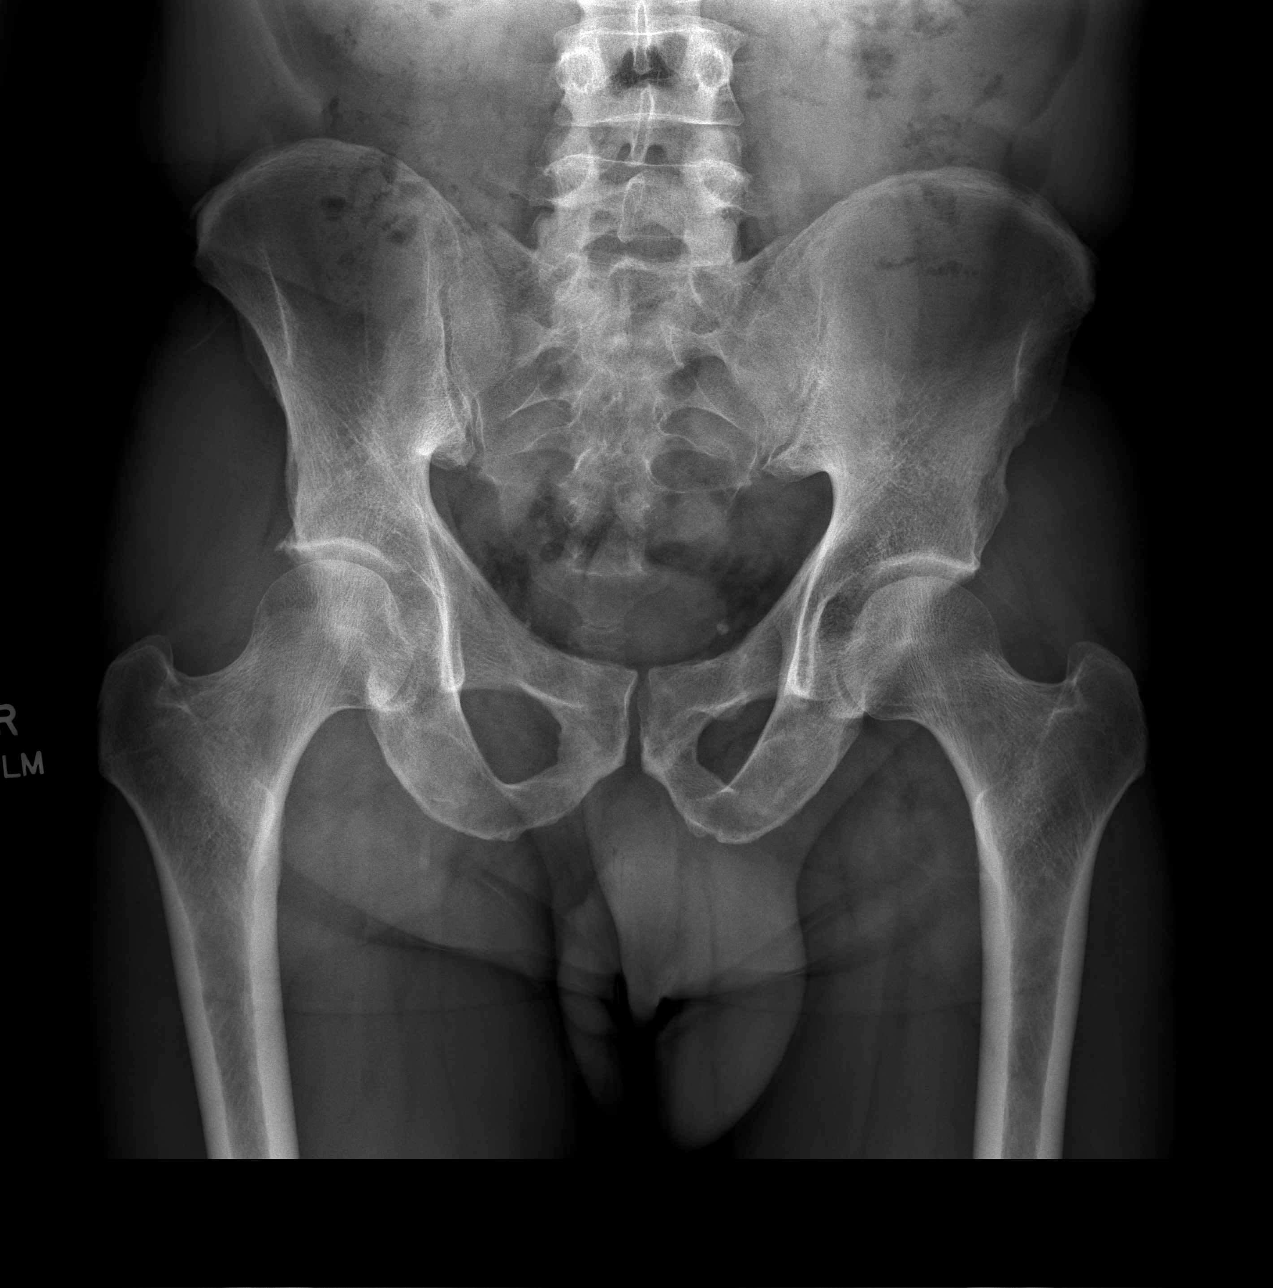

[w hip ap right]
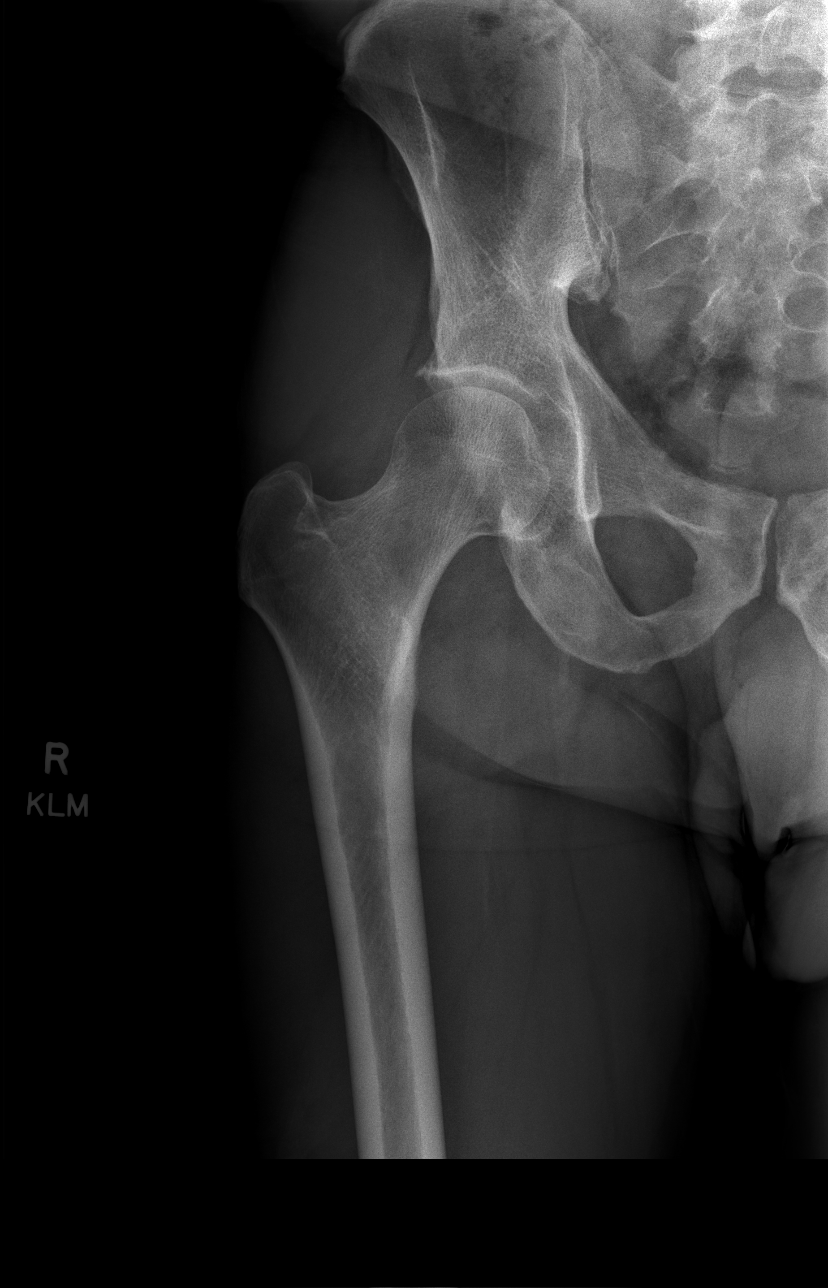

[w hip frog right]
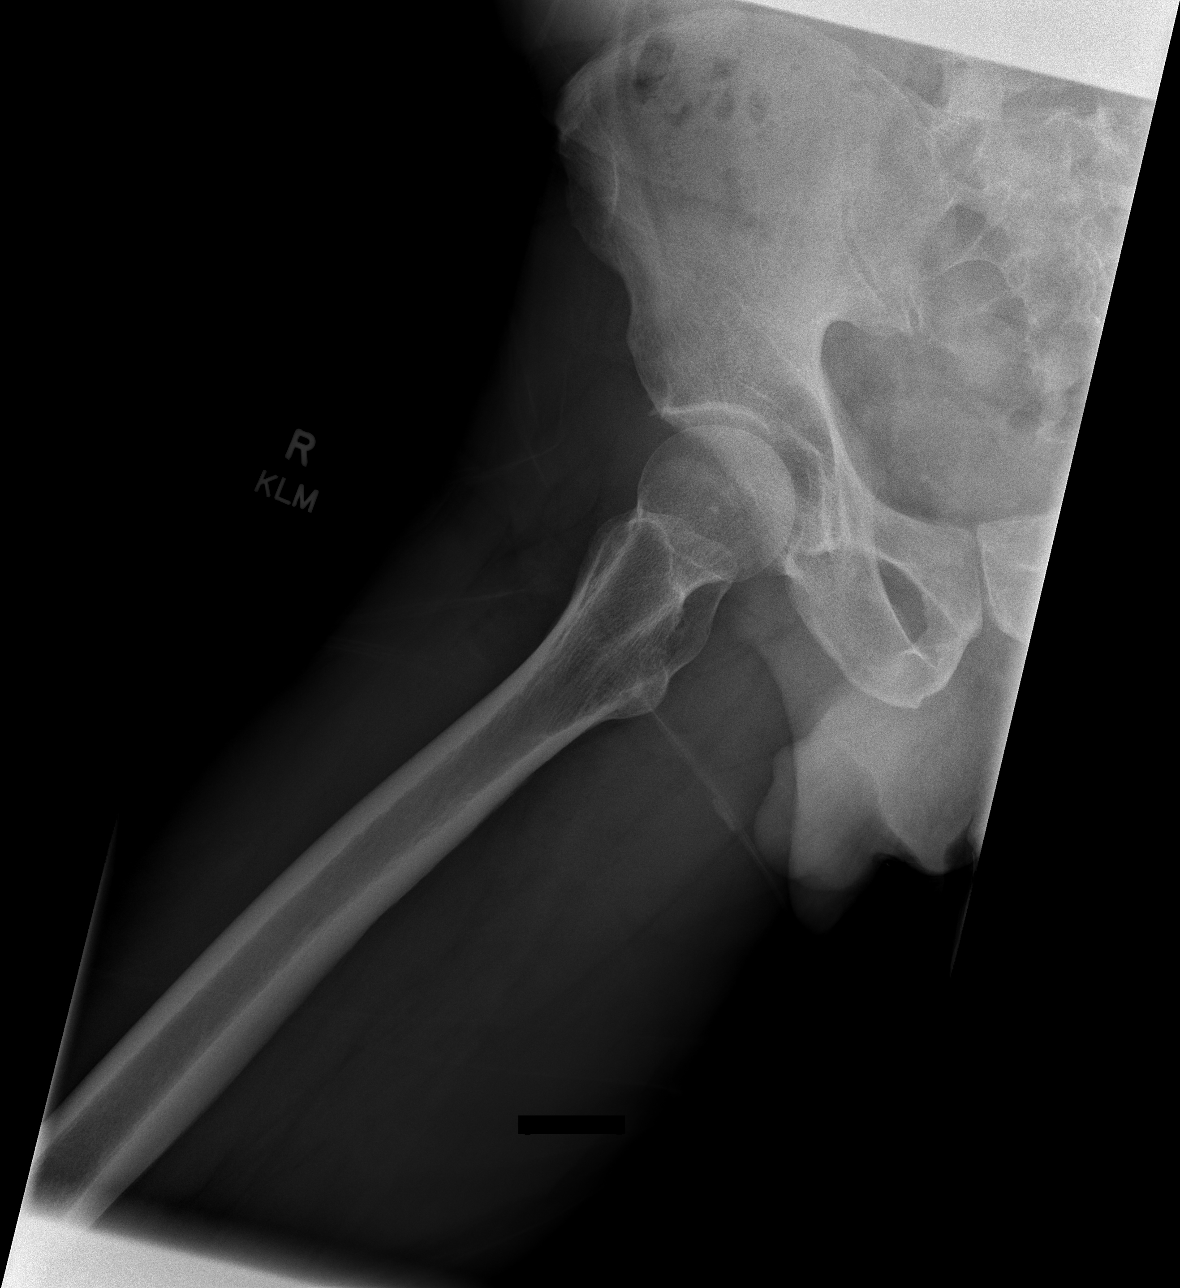

[3 of 3 positions shown; findings below may reference images not displayed]

FINDINGS: No acute fracture or dislocation. No aggressive osseous lesion.
Normal alignment.

Soft tissue are unremarkable. No radiopaque foreign body or soft
tissue emphysema.
IMPRESSION: No acute osseous injury of the right hip.

## 2023-11-20 NOTE — Progress Notes (Signed)
 [facility] Counselor/Therapist Progress Note  Patient ID: Cole Jensen, MRN: [id number],    Date: 09/20/2023  Time Spent: 50 min In Person @ [facility] - [facility] Office Time In: 2:00pm Time Out: 2:50pm   Treatment Type: Individual Therapy  Reported Symptoms: Cont'd reduction in anx/dep & stress  Mental Status Exam: Appearance:  Casual and Neat     Behavior: Appropriate and Sharing  Motor: Normal  Speech/Language:  Clear and Coherent  Affect: Appropriate  Mood: normal  Thought process: normal  Thought content:   WNL  Sensory/Perceptual disturbances:   WNL  Orientation: oriented to person, place, time/date, and situation  Attention: Good  Concentration: Good  Memory: WNL  Fund of knowledge:  Good  Insight:   Good  Judgment:  Good  Impulse Control: Good   Risk Assessment: Danger to Self:  No Self-injurious Behavior: No Danger to Others: No Duty to Warn:no Physical Aggression / Violence:No  Access to Firearms a concern: No  Gang Involvement:No   Subjective: Pt has been fully engaged in living his best life, 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 He has recently volunteered w/Hospice; talking to a 68yo woman about her life.   Pt is helping his Wife adjust to her anxiety & health issues.   Interventions: Insight-Oriented and Interpersonal  Diagnosis:Adjustment reaction with anxiety and depression  Anxiety about health  Plan: Cole Jensen will fortify his life personally so he can give more to his Wife Cole Jensen. He is happier than he has been in quite a while & hopes to cont to support his new way of being in the world.  Target Date: 10/25/2023  Progress: 8  Frequency: Monthly   Modality: Kennis Richerd LITTIE Hollace, LMFT

## 2023-11-21 ENCOUNTER — Ambulatory Visit: Admitting: Behavioral Health

## 2023-12-18 ENCOUNTER — Ambulatory Visit (INDEPENDENT_AMBULATORY_CARE_PROVIDER_SITE_OTHER): Admitting: Behavioral Health

## 2023-12-18 DIAGNOSIS — R4589 Other symptoms and signs involving emotional state: Secondary | ICD-10-CM | POA: Diagnosis not present

## 2023-12-18 DIAGNOSIS — F4323 Adjustment disorder with mixed anxiety and depressed mood: Secondary | ICD-10-CM | POA: Diagnosis not present

## 2023-12-18 NOTE — Progress Notes (Signed)
 [facility] Counselor/Therapist Progress Note  Patient ID: Cole Jensen, MRN: [id number],    Date: 12/18/2023  Time Spent: 30 min Caregility video; Pt is home in private & Provider working remotely from [employer]. Pt is aware of the risks/limitations of telehealth & consents to Tx today. Time In: 9:00am Time Out: 9:30am   Treatment Type: Individual Therapy  Reported Symptoms: Reduction in anx/dep & health anxiety  Mental Status Exam: Appearance:  Casual     Behavior: Appropriate and Sharing  Motor: Normal  Speech/Language:  Clear and Coherent  Affect: Appropriate  Mood: normal  Thought process: normal  Thought content:   WNL  Sensory/Perceptual disturbances:   WNL  Orientation: oriented to person, place, time/date, and situation  Attention: Good  Concentration: Good  Memory: WNL  Fund of knowledge:  Good  Insight:   Good  Judgment:  Good  Impulse Control: Good   Risk Assessment: Danger to Self:  No Self-injurious Behavior: No Danger to Others: No Duty to Warn:no Physical Aggression / Violence:No  Access to Firearms a concern: No  Gang Involvement:No   Subjective: Pt is doing well; multiple projects @ work are keeping him busy & he is actually declining to take on some new work due to his dec'd availability. He finds himself being much more decisive making business determinations & in his private life, he is also being more indep w/his decisions. This has improved his boundary-setting. He notices what a difference this makes & he does not have to please others all the time.   He will be travelling w/his Wife over the next year. They have plans for [place], [place] & a few other locations.   Interventions: Insight-Oriented and Interpersonal  Diagnosis:Adjustment reaction with anxiety and depression  Anxiety about health  Plan: Lamar & I discussed his progress & his decision to step away from psychotherapy @ the present time. We discussed Clinician's  availability in the future & he is aware how to call the [facility] Office if he wishes to schedule again in the future.  Target Date: TBD; termination of psychotherapy for now  Progress:9  Frequency: TBD  Modality: Kennis Richerd LITTIE Hollace, LMFT

## 2024-01-25 ENCOUNTER — Other Ambulatory Visit: Payer: Self-pay | Admitting: Medical Genetics

## 2024-01-25 DIAGNOSIS — Z006 Encounter for examination for normal comparison and control in clinical research program: Secondary | ICD-10-CM

## 2024-02-27 LAB — GENECONNECT MOLECULAR SCREEN: Genetic Analysis Overall Interpretation: NEGATIVE
